# Patient Record
Sex: Female | Born: 1995 | Hispanic: Yes | Smoking: Never smoker
Health system: Southern US, Community
[De-identification: ages and names within clinical notes are randomized; demographics above are authoritative.]

---

## 2020-07-22 ENCOUNTER — Emergency Department (HOSPITAL_COMMUNITY): Payer: 59

## 2020-07-22 ENCOUNTER — Other Ambulatory Visit: Payer: Self-pay

## 2020-07-22 ENCOUNTER — Emergency Department (HOSPITAL_COMMUNITY)
Admission: EM | Admit: 2020-07-22 | Discharge: 2020-07-22 | Disposition: A | Discharge status: Home / Self Care | Payer: 59 | Attending: Emergency Medicine | Admitting: Emergency Medicine

## 2020-07-22 ENCOUNTER — Encounter (HOSPITAL_COMMUNITY): Payer: Self-pay | Admitting: *Deleted

## 2020-07-22 DIAGNOSIS — R112 Nausea with vomiting, unspecified: Secondary | ICD-10-CM | POA: Diagnosis not present

## 2020-07-22 DIAGNOSIS — R202 Paresthesia of skin: Secondary | ICD-10-CM | POA: Diagnosis not present

## 2020-07-22 DIAGNOSIS — R519 Headache, unspecified: Secondary | ICD-10-CM | POA: Diagnosis not present

## 2020-07-22 LAB — URINALYSIS, ROUTINE W REFLEX MICROSCOPIC
Bacteria, UA: NONE SEEN
Bilirubin Urine: NEGATIVE
Glucose, UA: NEGATIVE mg/dL
Hgb urine dipstick: NEGATIVE
Ketones, ur: 80 mg/dL — AB
Nitrite: NEGATIVE
Protein, ur: NEGATIVE mg/dL
Specific Gravity, Urine: 1.02 (ref 1.005–1.030)
pH: 7 (ref 5.0–8.0)

## 2020-07-22 LAB — COMPREHENSIVE METABOLIC PANEL
ALT: 21 U/L (ref 0–44)
AST: 22 U/L (ref 15–41)
Albumin: 4.5 g/dL (ref 3.5–5.0)
Alkaline Phosphatase: 60 U/L (ref 38–126)
Anion gap: 8 (ref 5–15)
BUN: 13 mg/dL (ref 6–20)
CO2: 26 mmol/L (ref 22–32)
Calcium: 9.4 mg/dL (ref 8.9–10.3)
Chloride: 105 mmol/L (ref 98–111)
Creatinine, Ser: 0.64 mg/dL (ref 0.44–1.00)
GFR, Estimated: >60 mL/min (ref >60)
Glucose, Bld: 102 mg/dL — ABNORMAL HIGH (ref 70–99)
Potassium: 3.6 mmol/L (ref 3.5–5.1)
Sodium: 139 mmol/L (ref 135–145)
Total Bilirubin: 0.7 mg/dL (ref 0.3–1.2)
Total Protein: 7.6 g/dL (ref 6.5–8.1)

## 2020-07-22 LAB — CBC
HCT: 42.2 % (ref 36.0–46.0)
Hemoglobin: 14.1 g/dL (ref 12.0–15.0)
MCH: 29.9 pg (ref 26.0–34.0)
MCHC: 33.4 g/dL (ref 30.0–36.0)
MCV: 89.6 fL (ref 80.0–100.0)
Platelets: 210 10*3/uL (ref 150–400)
RBC: 4.71 MIL/uL (ref 3.87–5.11)
RDW: 11.6 % (ref 11.5–15.5)
WBC: 10.7 10*3/uL — ABNORMAL HIGH (ref 4.0–10.5)
nRBC: 0 % (ref 0.0–0.2)

## 2020-07-22 LAB — POC URINE PREG, ED: Preg Test, Ur: NEGATIVE

## 2020-07-22 LAB — HCG, QUANTITATIVE, PREGNANCY: hCG, Beta Chain, Quant, S: <1 m[IU]/mL (ref <5)

## 2020-07-22 MED ORDER — SODIUM CHLORIDE 0.9 % IV BOLUS
500.0000 mL | Freq: Once | INTRAVENOUS | Status: AC
  Administered 2020-07-22: 500 mL via INTRAVENOUS

## 2020-07-22 MED ORDER — PROCHLORPERAZINE EDISYLATE 10 MG/2ML IJ SOLN
10.0000 mg | Freq: Once | INTRAMUSCULAR | Status: AC
  Administered 2020-07-22: 10 mg via INTRAVENOUS
  Filled 2020-07-22: qty 2

## 2020-07-22 MED ORDER — KETOROLAC TROMETHAMINE 15 MG/ML IJ SOLN
15.0000 mg | Freq: Once | INTRAMUSCULAR | Status: AC
  Administered 2020-07-22: 15 mg via INTRAVENOUS
  Filled 2020-07-22: qty 1

## 2020-07-22 MED ORDER — DIPHENHYDRAMINE HCL 50 MG/ML IJ SOLN
25.0000 mg | Freq: Once | INTRAMUSCULAR | Status: AC
  Administered 2020-07-22: 25 mg via INTRAVENOUS
  Filled 2020-07-22: qty 1

## 2020-07-22 NOTE — ED Notes (Signed)
Will give meds following preg test.

## 2020-07-22 NOTE — ED Notes (Signed)
Pt is resting.

## 2020-07-22 NOTE — Discharge Instructions (Addendum)
You were evaluated in the Emergency Department and after careful evaluation, we did not find any emergent condition requiring admission or further testing in the hospital.  Your work-up today was overall reassuring.  You may take up to 800 mg (4 pills) of ibuprofen up to 3 times daily for headaches.  Please follow-up with your primary care doctor for further evaluation for migraines and possible prescriptions of medications.  Please return to the Emergency Department if you experience any worsening of your condition.   Thank you for allowing [place] to be a part of your care.

## 2020-07-22 NOTE — ED Triage Notes (Signed)
C/o headache with nausea onset today

## 2020-07-22 NOTE — ED Provider Notes (Signed)
[facility] EMERGENCY DEPARTMENT Provider Note   CSN: [id number] Arrival date & time: 07/22/20  1555     History No chief complaint on file.   Suzanne Watson is a 25 y.o. female.  HPI 25 year old female with complaints of headache, several episodes of nausea and emesis, tingling around the lips and hands which began at work and progressively got worse.  Patient said initially she started develop headache at work and started to develop the above symptoms over the next several hours.  She took 1 pill of ibuprofen with mild relief.  By the time she arrived to the ER symptoms have slightly improved.  She states that she has no diagnosed history of migraines, but has had several headaches like this in her remote past.  She was concerned that she has never had vomiting.  She denies any abdominal pain.    History reviewed. No pertinent past medical history.  There are no problems to display for this patient.   History reviewed. No pertinent surgical history.   OB History   No obstetric history on file.     No family history on file.  Social History   Tobacco Use   Smoking status: Never   Smokeless tobacco: Never  Substance Use Topics   Alcohol use: Never   Drug use: Never    Home Medications Prior to Admission medications   Not on File    Allergies    Patient has no allergy information on record.  Review of Systems   Review of Systems  Constitutional:  Negative for chills and fever.  HENT:  Negative for ear pain and sore throat.   Eyes:  Negative for pain and visual disturbance.  Respiratory:  Negative for cough and shortness of breath.   Cardiovascular:  Negative for chest pain and palpitations.  Gastrointestinal:  Positive for nausea and vomiting. Negative for abdominal pain.  Genitourinary:  Negative for dysuria and hematuria.  Musculoskeletal:  Negative for arthralgias and back pain.  Skin:  Negative for color change and rash.  Neurological:  Positive for  numbness and headaches. Negative for seizures and syncope.  All other systems reviewed and are negative.  Physical Exam Updated Vital Signs BP 114/72 (BP Location: Right Arm)   Pulse 82   Temp 98.8 F (37.1 C) (Oral)   Resp 16   Ht 5' (1.524 m)   Wt 45.4 kg   LMP 07/11/2020   SpO2 100%   BMI 19.53 kg/m   Physical Exam Vitals and nursing note reviewed.  Constitutional:      General: She is not in acute distress.    Appearance: She is well-developed.  HENT:     Head: Normocephalic and atraumatic.  Eyes:     Conjunctiva/sclera: Conjunctivae normal.  Cardiovascular:     Rate and Rhythm: Normal rate and regular rhythm.     Heart sounds: No murmur heard. Pulmonary:     Effort: Pulmonary effort is normal. No respiratory distress.     Breath sounds: Normal breath sounds.  Abdominal:     Palpations: Abdomen is soft.     Tenderness: There is no abdominal tenderness.  Musculoskeletal:     Cervical back: Neck supple.  Skin:    General: Skin is warm and dry.  Neurological:     General: No focal deficit present.     Mental Status: She is alert and oriented to person, place, and time.     Sensory: No sensory deficit.     Motor: No weakness.  Comments: Mental Status:  Alert, thought content appropriate, able to give a coherent history. Speech fluent without evidence of aphasia. Able to follow 2 step commands without difficulty.  Cranial Nerves:  II:  Peripheral visual fields grossly normal, pupils equal, round, reactive to light III,IV, VI: ptosis not present, extra-ocular motions intact bilaterally  V,VII: smile symmetric, facial light touch sensation equal VIII: hearing grossly normal to voice  X: uvula elevates symmetrically  XI: bilateral shoulder shrug symmetric and strong XII: midline tongue extension without fassiculations Motor:  Normal tone. 5/5 strength of BUE and BLE major muscle groups including strong and equal grip strength and dorsiflexion/plantar  flexion Sensory: light touch normal in all extremities. Cerebellar: normal finger-to-nose with bilateral upper extremities, Romberg sign absent Gait: normal gait and balance. Able to walk on toes and heels with ease.       ED Results / Procedures / Treatments   Labs (all labs ordered are listed, but only abnormal results are displayed) Labs Reviewed  CBC - Abnormal; Notable for the following components:      Result Value   WBC 10.7 (*)    All other components within normal limits  COMPREHENSIVE METABOLIC PANEL - Abnormal; Notable for the following components:   Glucose, Bld 102 (*)    All other components within normal limits  URINALYSIS, ROUTINE W REFLEX MICROSCOPIC - Abnormal; Notable for the following components:   APPearance HAZY (*)    Ketones, ur 80 (*)    Leukocytes,Ua MODERATE (*)    All other components within normal limits  HCG, QUANTITATIVE, PREGNANCY  POC URINE PREG, ED    EKG None  Radiology CT Head Wo Contrast  Result Date: 07/22/2020 CLINICAL DATA:  Headache. EXAM: CT HEAD WITHOUT CONTRAST TECHNIQUE: Contiguous axial images were obtained from the base of the skull through the vertex without intravenous contrast. COMPARISON:  None. FINDINGS: Brain: No evidence of acute infarction, hemorrhage, hydrocephalus, extra-axial collection or mass lesion/mass effect. Vascular: No hyperdense vessel or unexpected calcification. Skull: Normal. Negative for fracture or focal lesion. Sinuses/Orbits: No acute finding. Other: None. IMPRESSION: No acute intracranial abnormality. Electronically Signed   By: Aram Candela M.D.   On: 07/22/2020 20:27    Procedures Procedures   Medications Ordered in ED Medications  prochlorperazine (COMPAZINE) injection 10 mg (10 mg Intravenous Given 07/22/20 1822)  diphenhydrAMINE (BENADRYL) injection 25 mg (25 mg Intravenous Given 07/22/20 1824)  ketorolac (TORADOL) 15 MG/ML injection 15 mg (15 mg Intravenous Given 07/22/20 1825)  sodium  chloride 0.9 % bolus 500 mL (500 mLs Intravenous New Bag/Given 07/22/20 1912)    ED Course  I have reviewed the triage vital signs and the nursing notes.  Pertinent labs & imaging results that were available during my care of the patient were reviewed by me and considered in my medical decision making (see chart for details).    MDM Rules/Calculators/A&P                          Patient presents with headache, nausea, vomiting, tingling in her lips and extremities.  Neuro exam reassuring.  Abdomen is soft and nontender.  CT of the head unremarkable.  Labs largely unremarkable.  She does have some evidence of ketones in her urine likely suggestive of dehydration.  She has no urinary symptoms to suggest UTI.  Low suspicion for [facility], ICH, meningitis, temporal arteritis.  Patient was given migraine cocktail with improvement here in the ER.  Patient is afebrile with  no focal neurodeficits, nuchal rigidity, or change in vision.  Low suspicion for stroke.  Encouraged increasing ibuprofen when she has headaches drainage milligrams up to 3 times daily for, will follow up with PCP for further evaluation and treatment of migraines.  Her and her husband at bedside voiced understanding and are agreeable  Final Clinical Impression(s) / ED Diagnoses Final diagnoses:  Bad headache    Rx / DC Orders ED Discharge Orders     None        Leone Brand 07/22/20 2041    Vanetta Mulders, MD 07/25/20 1400

## 2022-10-30 IMAGING — CT CT HEAD W/O CM
4 series · 17 of 47 positions shown, 19 images · non-contrast
Comparison: None.

CLINICAL DATA: Headache.

EXAM:
CT HEAD WITHOUT CONTRAST
TECHNIQUE: Contiguous axial images were obtained from the base of the skull
through the vertex without intravenous contrast.

[Series 2: head w o · axial · 0.52mm/px · z∈[+1694,+1804]mm · 7 of 30 slices shown, 9 images]
[im 4/30  brain]
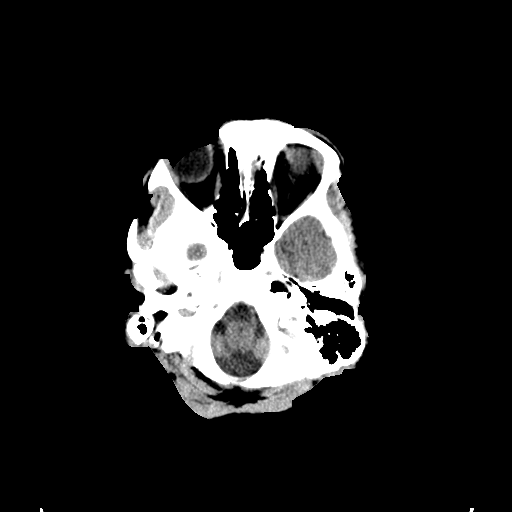
[im 4/30  bone]
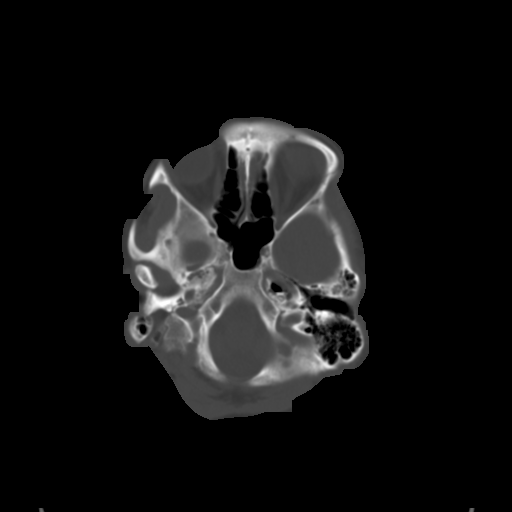
[im 8/30  brain]
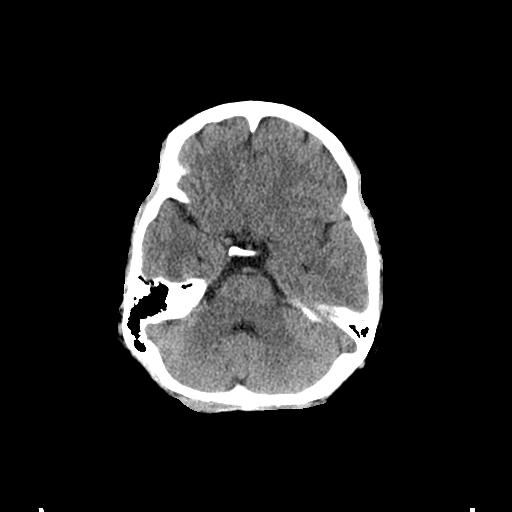
[im 11/30  brain]
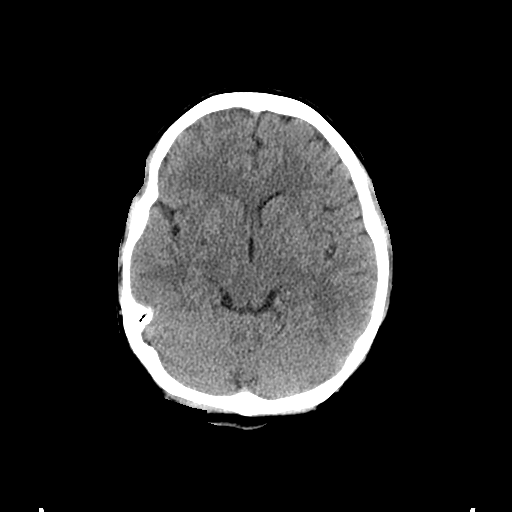
[im 15/30  brain]
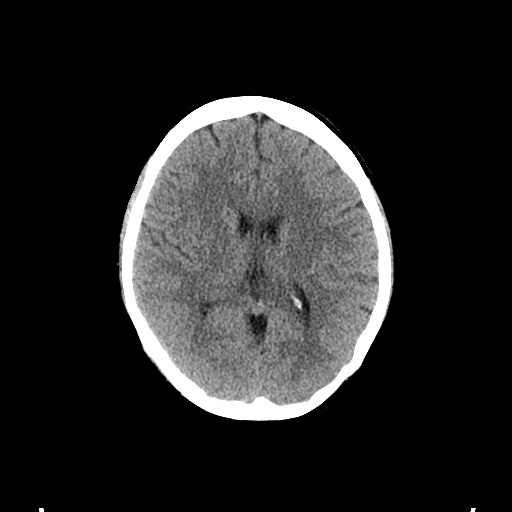
[im 19/30  brain]
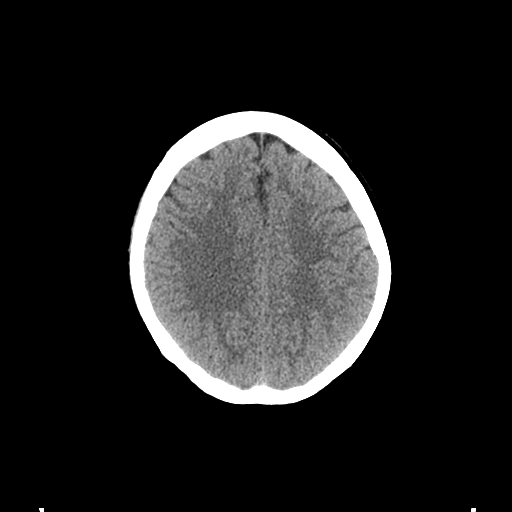
[im 19/30  bone]
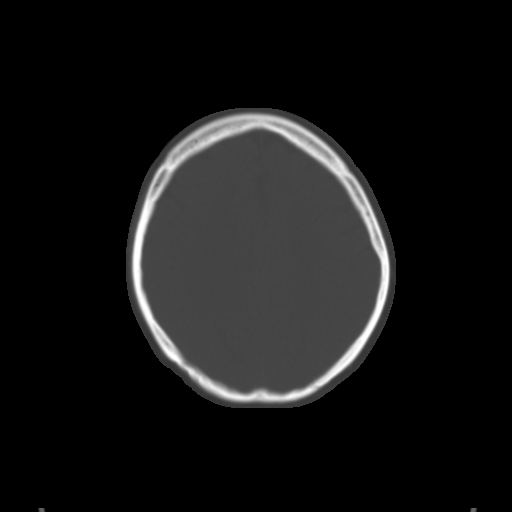
[im 22/30  brain]
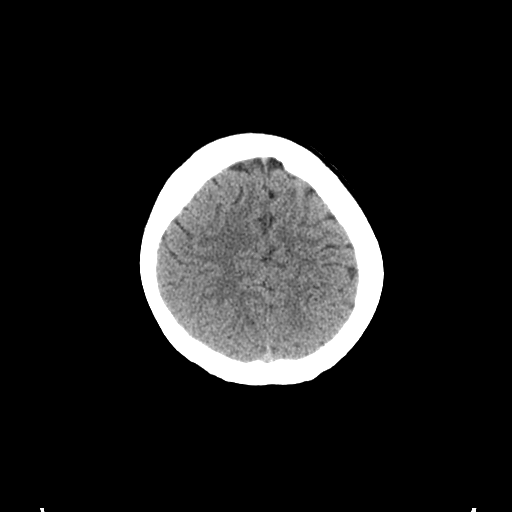
[im 26/30  brain]
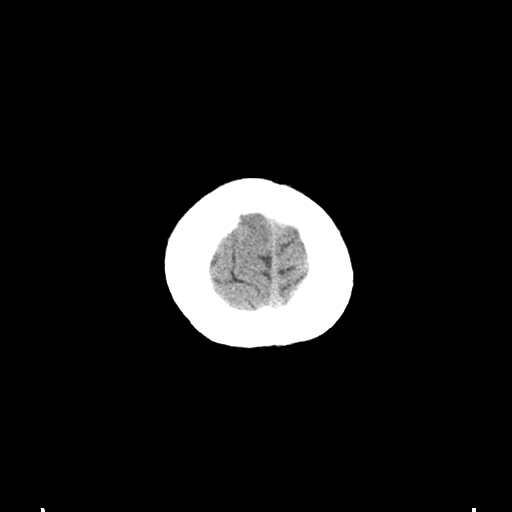

[Series 3: head bone · axial · 0.52mm/px · z∈[+1693,+1745]mm · 4 of 75 slices shown]
[im 8/75  bone]
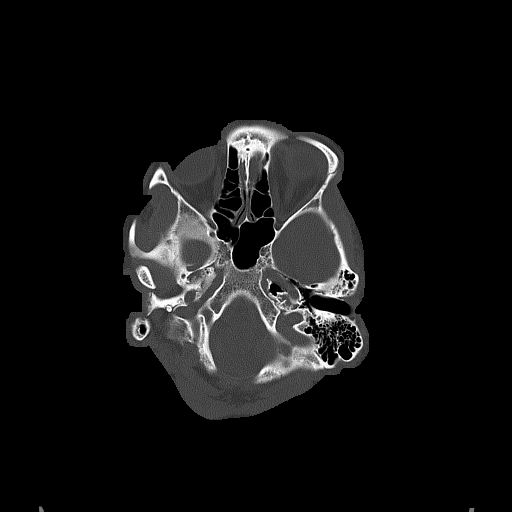
[im 15/75  bone]
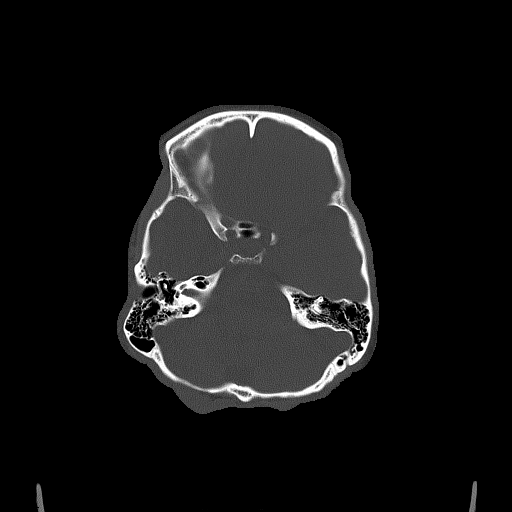
[im 23/75  bone]
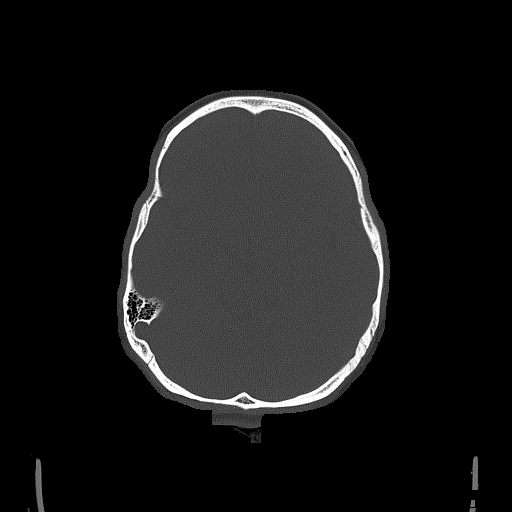
[im 34/75  bone]
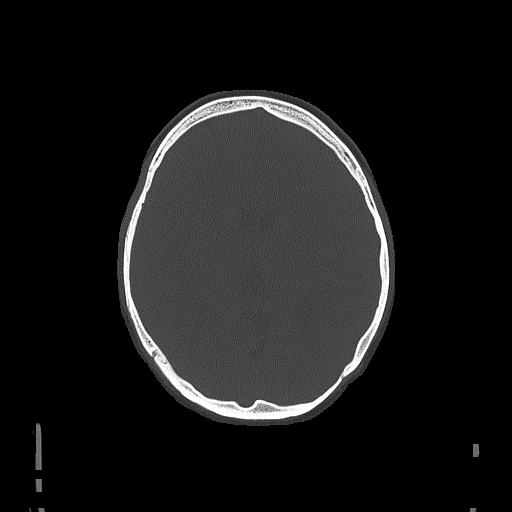

[Series 4: coronal soft · coronal · 0.35mm/px · 3 of 65 slices shown]
[im 22/65  brain]
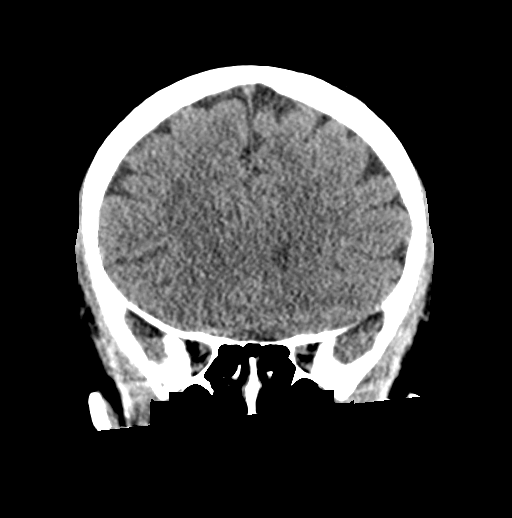
[im 29/65  brain]
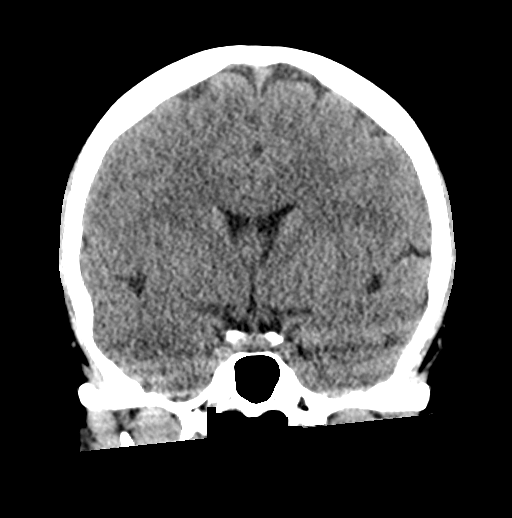
[im 36/65  brain]
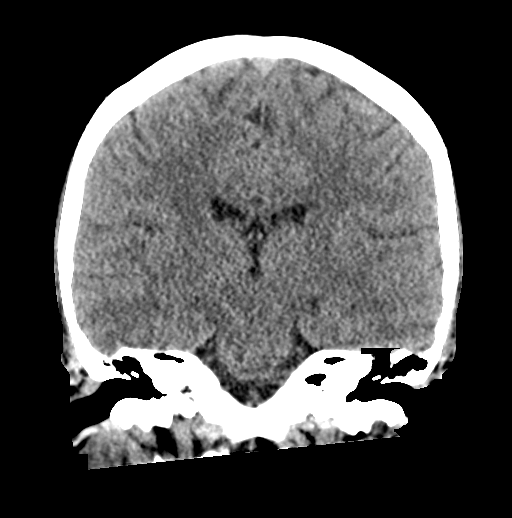

[Series 5: sagittal soft · sagittal · 0.35mm/px · 3 of 57 slices shown]
[im 19/57  brain]
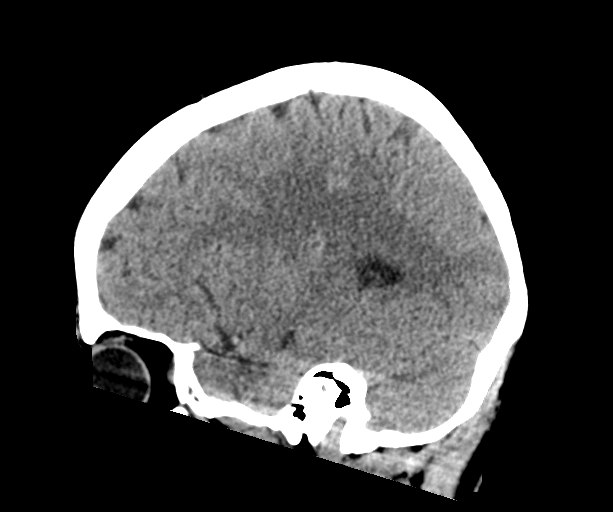
[im 29/57  brain]
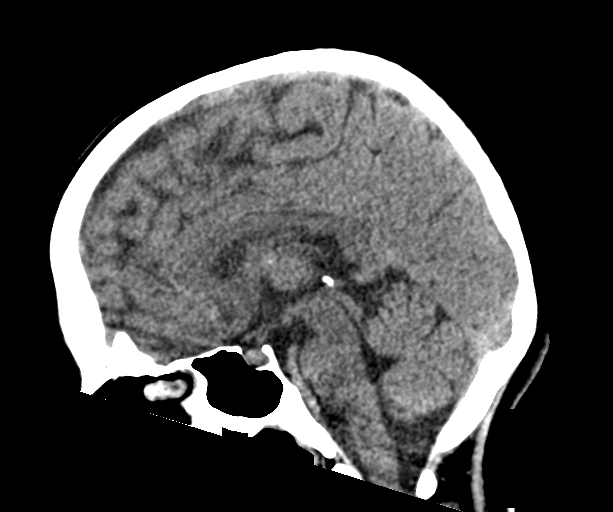
[im 38/57  brain]
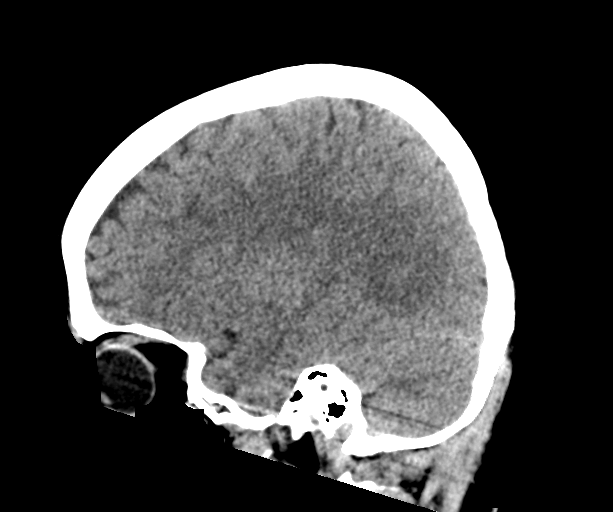

[17 of 47 positions shown; findings below may reference images not displayed]

FINDINGS: Brain: No evidence of acute infarction, hemorrhage, hydrocephalus,
extra-axial collection or mass lesion/mass effect.

Vascular: No hyperdense vessel or unexpected calcification.

Skull: Normal. Negative for fracture or focal lesion.

Sinuses/Orbits: No acute finding.

Other: None.
IMPRESSION: No acute intracranial abnormality.
